# Patient Record
Sex: Female | Born: 1960 | Race: White | Hispanic: No | Marital: Married | Smoking: Former smoker
Health system: Southern US, Community
[De-identification: ages and names within clinical notes are randomized; demographics above are authoritative.]

---

## 1997-07-25 ENCOUNTER — Other Ambulatory Visit: Admission: RE | Admit: 1997-07-25 | Discharge: 1997-07-25 | Payer: Self-pay | Admitting: Obstetrics and Gynecology

## 2001-10-22 ENCOUNTER — Encounter: Payer: Self-pay | Admitting: *Deleted

## 2001-10-22 ENCOUNTER — Encounter: Admission: RE | Admit: 2001-10-22 | Discharge: 2001-10-22 | Payer: Self-pay | Admitting: *Deleted

## 2002-04-22 ENCOUNTER — Encounter: Admission: RE | Admit: 2002-04-22 | Discharge: 2002-04-22 | Payer: Self-pay | Admitting: Internal Medicine

## 2002-04-27 ENCOUNTER — Encounter: Payer: Self-pay | Admitting: Internal Medicine

## 2002-04-27 ENCOUNTER — Encounter: Admission: RE | Admit: 2002-04-27 | Discharge: 2002-04-27 | Payer: Self-pay | Admitting: Internal Medicine

## 2003-02-11 HISTORY — PX: AUGMENTATION MAMMAPLASTY: SUR837

## 2003-05-11 ENCOUNTER — Encounter: Admission: RE | Admit: 2003-05-11 | Discharge: 2003-05-11 | Payer: Self-pay | Admitting: Obstetrics and Gynecology

## 2003-08-09 ENCOUNTER — Emergency Department (HOSPITAL_COMMUNITY): Admission: EM | Admit: 2003-08-09 | Discharge: 2003-08-09 | Payer: Self-pay | Admitting: Emergency Medicine

## 2003-08-10 ENCOUNTER — Emergency Department (HOSPITAL_COMMUNITY): Admission: EM | Admit: 2003-08-10 | Discharge: 2003-08-10 | Payer: Self-pay | Admitting: Emergency Medicine

## 2004-09-19 ENCOUNTER — Encounter: Admission: RE | Admit: 2004-09-19 | Discharge: 2004-09-19 | Payer: Self-pay | Admitting: Obstetrics and Gynecology

## 2009-01-11 ENCOUNTER — Encounter: Admission: RE | Admit: 2009-01-11 | Discharge: 2009-01-11 | Payer: Self-pay | Admitting: Family Medicine

## 2010-12-06 ENCOUNTER — Other Ambulatory Visit: Payer: Self-pay | Admitting: Family Medicine

## 2010-12-06 DIAGNOSIS — Z1231 Encounter for screening mammogram for malignant neoplasm of breast: Secondary | ICD-10-CM

## 2010-12-25 ENCOUNTER — Ambulatory Visit
Admission: RE | Admit: 2010-12-25 | Discharge: 2010-12-25 | Disposition: A | Discharge status: Home / Self Care | Payer: BC Managed Care – PPO | Source: Ambulatory Visit | Attending: Family Medicine | Admitting: Family Medicine

## 2010-12-25 DIAGNOSIS — Z1231 Encounter for screening mammogram for malignant neoplasm of breast: Secondary | ICD-10-CM

## 2013-06-14 ENCOUNTER — Other Ambulatory Visit: Payer: Self-pay

## 2013-06-14 DIAGNOSIS — Z1231 Encounter for screening mammogram for malignant neoplasm of breast: Secondary | ICD-10-CM

## 2013-06-28 ENCOUNTER — Encounter (INDEPENDENT_AMBULATORY_CARE_PROVIDER_SITE_OTHER): Payer: Self-pay

## 2013-06-28 ENCOUNTER — Ambulatory Visit
Admission: RE | Admit: 2013-06-28 | Discharge: 2013-06-28 | Disposition: A | Discharge status: Home / Self Care | Payer: BC Managed Care – PPO | Source: Ambulatory Visit

## 2013-06-28 DIAGNOSIS — Z1231 Encounter for screening mammogram for malignant neoplasm of breast: Secondary | ICD-10-CM

## 2015-02-28 ENCOUNTER — Other Ambulatory Visit: Payer: Self-pay

## 2015-02-28 DIAGNOSIS — Z1231 Encounter for screening mammogram for malignant neoplasm of breast: Secondary | ICD-10-CM

## 2015-04-10 ENCOUNTER — Ambulatory Visit
Admission: RE | Admit: 2015-04-10 | Discharge: 2015-04-10 | Disposition: A | Discharge status: Home / Self Care | Payer: 59 | Source: Ambulatory Visit

## 2015-04-10 DIAGNOSIS — Z1231 Encounter for screening mammogram for malignant neoplasm of breast: Secondary | ICD-10-CM

## 2016-07-29 ENCOUNTER — Other Ambulatory Visit (HOSPITAL_COMMUNITY)
Admission: RE | Admit: 2016-07-29 | Discharge: 2016-07-29 | Disposition: A | Discharge status: Home / Self Care | Payer: 59 | Source: Ambulatory Visit | Attending: Family Medicine | Admitting: Family Medicine

## 2016-07-29 ENCOUNTER — Other Ambulatory Visit: Payer: Self-pay | Admitting: Family Medicine

## 2016-07-29 DIAGNOSIS — Z124 Encounter for screening for malignant neoplasm of cervix: Secondary | ICD-10-CM | POA: Diagnosis not present

## 2016-07-31 LAB — CYTOLOGY - PAP: HPV: NOT DETECTED

## 2016-11-11 ENCOUNTER — Other Ambulatory Visit: Payer: Self-pay | Admitting: Family Medicine

## 2016-11-11 DIAGNOSIS — Z1231 Encounter for screening mammogram for malignant neoplasm of breast: Secondary | ICD-10-CM

## 2016-11-20 ENCOUNTER — Ambulatory Visit: Payer: 59

## 2016-11-24 ENCOUNTER — Other Ambulatory Visit: Payer: Self-pay | Admitting: Family Medicine

## 2016-11-24 DIAGNOSIS — Z1231 Encounter for screening mammogram for malignant neoplasm of breast: Secondary | ICD-10-CM

## 2016-12-10 ENCOUNTER — Ambulatory Visit
Admission: RE | Admit: 2016-12-10 | Discharge: 2016-12-10 | Disposition: A | Discharge status: Home / Self Care | Payer: 59 | Source: Ambulatory Visit | Attending: Family Medicine | Admitting: Family Medicine

## 2016-12-10 DIAGNOSIS — Z1231 Encounter for screening mammogram for malignant neoplasm of breast: Secondary | ICD-10-CM

## 2017-08-26 DIAGNOSIS — Z0000 Encounter for general adult medical examination without abnormal findings: Secondary | ICD-10-CM | POA: Diagnosis not present

## 2017-08-26 DIAGNOSIS — G4700 Insomnia, unspecified: Secondary | ICD-10-CM | POA: Diagnosis not present

## 2017-08-26 DIAGNOSIS — Z131 Encounter for screening for diabetes mellitus: Secondary | ICD-10-CM | POA: Diagnosis not present

## 2017-08-26 DIAGNOSIS — Z13220 Encounter for screening for lipoid disorders: Secondary | ICD-10-CM | POA: Diagnosis not present

## 2017-08-26 DIAGNOSIS — Z6820 Body mass index (BMI) 20.0-20.9, adult: Secondary | ICD-10-CM | POA: Diagnosis not present

## 2018-03-24 ENCOUNTER — Other Ambulatory Visit: Payer: Self-pay | Admitting: Family Medicine

## 2018-03-24 DIAGNOSIS — Z1231 Encounter for screening mammogram for malignant neoplasm of breast: Secondary | ICD-10-CM

## 2018-03-26 ENCOUNTER — Ambulatory Visit
Admission: RE | Admit: 2018-03-26 | Discharge: 2018-03-26 | Disposition: A | Discharge status: Home / Self Care | Payer: BLUE CROSS/BLUE SHIELD | Source: Ambulatory Visit | Attending: Family Medicine | Admitting: Family Medicine

## 2018-03-26 DIAGNOSIS — Z1231 Encounter for screening mammogram for malignant neoplasm of breast: Secondary | ICD-10-CM | POA: Diagnosis not present

## 2018-05-12 DIAGNOSIS — F4323 Adjustment disorder with mixed anxiety and depressed mood: Secondary | ICD-10-CM | POA: Diagnosis not present

## 2018-06-10 DIAGNOSIS — F4323 Adjustment disorder with mixed anxiety and depressed mood: Secondary | ICD-10-CM | POA: Diagnosis not present

## 2018-07-08 DIAGNOSIS — F4323 Adjustment disorder with mixed anxiety and depressed mood: Secondary | ICD-10-CM | POA: Diagnosis not present

## 2018-07-15 DIAGNOSIS — F4323 Adjustment disorder with mixed anxiety and depressed mood: Secondary | ICD-10-CM | POA: Diagnosis not present

## 2018-07-20 DIAGNOSIS — F4323 Adjustment disorder with mixed anxiety and depressed mood: Secondary | ICD-10-CM | POA: Diagnosis not present

## 2018-07-29 DIAGNOSIS — F4323 Adjustment disorder with mixed anxiety and depressed mood: Secondary | ICD-10-CM | POA: Diagnosis not present

## 2018-08-19 DIAGNOSIS — F4323 Adjustment disorder with mixed anxiety and depressed mood: Secondary | ICD-10-CM | POA: Diagnosis not present

## 2018-08-23 DIAGNOSIS — F4323 Adjustment disorder with mixed anxiety and depressed mood: Secondary | ICD-10-CM | POA: Diagnosis not present

## 2018-08-31 DIAGNOSIS — F4323 Adjustment disorder with mixed anxiety and depressed mood: Secondary | ICD-10-CM | POA: Diagnosis not present

## 2018-09-07 DIAGNOSIS — F4323 Adjustment disorder with mixed anxiety and depressed mood: Secondary | ICD-10-CM | POA: Diagnosis not present

## 2018-09-08 DIAGNOSIS — F419 Anxiety disorder, unspecified: Secondary | ICD-10-CM | POA: Diagnosis not present

## 2018-09-08 DIAGNOSIS — M8580 Other specified disorders of bone density and structure, unspecified site: Secondary | ICD-10-CM | POA: Diagnosis not present

## 2018-09-08 DIAGNOSIS — G4700 Insomnia, unspecified: Secondary | ICD-10-CM | POA: Diagnosis not present

## 2018-09-21 DIAGNOSIS — F4323 Adjustment disorder with mixed anxiety and depressed mood: Secondary | ICD-10-CM | POA: Diagnosis not present

## 2018-09-25 DIAGNOSIS — F4323 Adjustment disorder with mixed anxiety and depressed mood: Secondary | ICD-10-CM | POA: Diagnosis not present

## 2018-10-26 DIAGNOSIS — F4323 Adjustment disorder with mixed anxiety and depressed mood: Secondary | ICD-10-CM | POA: Diagnosis not present

## 2018-11-10 DIAGNOSIS — F4323 Adjustment disorder with mixed anxiety and depressed mood: Secondary | ICD-10-CM | POA: Diagnosis not present

## 2018-11-24 DIAGNOSIS — F4323 Adjustment disorder with mixed anxiety and depressed mood: Secondary | ICD-10-CM | POA: Diagnosis not present

## 2018-12-07 DIAGNOSIS — F4323 Adjustment disorder with mixed anxiety and depressed mood: Secondary | ICD-10-CM | POA: Diagnosis not present

## 2018-12-09 DIAGNOSIS — F4323 Adjustment disorder with mixed anxiety and depressed mood: Secondary | ICD-10-CM | POA: Diagnosis not present

## 2019-01-03 DIAGNOSIS — F4323 Adjustment disorder with mixed anxiety and depressed mood: Secondary | ICD-10-CM | POA: Diagnosis not present

## 2019-01-18 DIAGNOSIS — F4323 Adjustment disorder with mixed anxiety and depressed mood: Secondary | ICD-10-CM | POA: Diagnosis not present

## 2019-01-26 DIAGNOSIS — F4323 Adjustment disorder with mixed anxiety and depressed mood: Secondary | ICD-10-CM | POA: Diagnosis not present

## 2019-02-16 DIAGNOSIS — F4323 Adjustment disorder with mixed anxiety and depressed mood: Secondary | ICD-10-CM | POA: Diagnosis not present

## 2019-02-18 DIAGNOSIS — F4323 Adjustment disorder with mixed anxiety and depressed mood: Secondary | ICD-10-CM | POA: Diagnosis not present

## 2019-03-01 DIAGNOSIS — F4323 Adjustment disorder with mixed anxiety and depressed mood: Secondary | ICD-10-CM | POA: Diagnosis not present

## 2019-03-10 DIAGNOSIS — F4323 Adjustment disorder with mixed anxiety and depressed mood: Secondary | ICD-10-CM | POA: Diagnosis not present

## 2019-03-16 DIAGNOSIS — F4323 Adjustment disorder with mixed anxiety and depressed mood: Secondary | ICD-10-CM | POA: Diagnosis not present

## 2019-04-19 DIAGNOSIS — F4323 Adjustment disorder with mixed anxiety and depressed mood: Secondary | ICD-10-CM | POA: Diagnosis not present

## 2019-05-03 DIAGNOSIS — F4323 Adjustment disorder with mixed anxiety and depressed mood: Secondary | ICD-10-CM | POA: Diagnosis not present

## 2019-07-05 ENCOUNTER — Other Ambulatory Visit: Payer: Self-pay | Admitting: Family Medicine

## 2019-07-05 DIAGNOSIS — Z1231 Encounter for screening mammogram for malignant neoplasm of breast: Secondary | ICD-10-CM

## 2019-07-07 ENCOUNTER — Other Ambulatory Visit: Payer: Self-pay

## 2019-07-07 ENCOUNTER — Ambulatory Visit
Admission: RE | Admit: 2019-07-07 | Discharge: 2019-07-07 | Disposition: A | Discharge status: Home / Self Care | Payer: BLUE CROSS/BLUE SHIELD | Source: Ambulatory Visit | Attending: Family Medicine | Admitting: Family Medicine

## 2019-07-07 DIAGNOSIS — Z1231 Encounter for screening mammogram for malignant neoplasm of breast: Secondary | ICD-10-CM | POA: Diagnosis not present

## 2019-07-17 LAB — EXTERNAL GENERIC LAB PROCEDURE: COLOGUARD: NEGATIVE

## 2019-09-08 ENCOUNTER — Other Ambulatory Visit: Payer: Self-pay | Admitting: Family Medicine

## 2019-09-08 DIAGNOSIS — M8580 Other specified disorders of bone density and structure, unspecified site: Secondary | ICD-10-CM

## 2019-12-07 ENCOUNTER — Ambulatory Visit
Admission: RE | Admit: 2019-12-07 | Discharge: 2019-12-07 | Disposition: A | Discharge status: Home / Self Care | Payer: BC Managed Care – PPO | Source: Ambulatory Visit | Attending: Family Medicine | Admitting: Family Medicine

## 2019-12-07 ENCOUNTER — Other Ambulatory Visit: Payer: Self-pay

## 2019-12-07 DIAGNOSIS — M8580 Other specified disorders of bone density and structure, unspecified site: Secondary | ICD-10-CM

## 2020-05-08 ENCOUNTER — Encounter: Payer: Self-pay | Admitting: Plastic Surgery

## 2020-05-08 ENCOUNTER — Other Ambulatory Visit: Payer: Self-pay

## 2020-05-08 ENCOUNTER — Ambulatory Visit (INDEPENDENT_AMBULATORY_CARE_PROVIDER_SITE_OTHER): Payer: Self-pay | Admitting: Plastic Surgery

## 2020-05-08 DIAGNOSIS — N6481 Ptosis of breast: Secondary | ICD-10-CM | POA: Insufficient documentation

## 2020-05-08 NOTE — Progress Notes (Signed)
Patient ID: Hayley Ellis, female    DOB: 04-18-1960, 60 y.o.   MRN: [id number]   Chief Complaint  Patient presents with  . Advice Only  . Breast Problem    The patient is a 60 year old female here for evaluation of her breasts.  The patient had saline implants placed in 2005 by Dr. Dub Amis.  She went from a B cup to a D/DD cup.  She is not sure about the size or the brand.  She is going to try and find her card at home.  We will try and get information as well with a release of information form.  She has mammograms regularly and states she is up-to-date.  The last mammogram was negative.  She is 5 feet 4 inches tall and weighs 123 pounds.  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  She has had facial surgery by Dr. Conni Elliot in [place].  He is no longer operating so she wanted to find somebody closer to her home here in [place].  On palpation I do not feel anything concerning.  The implants appear to be intact.  She does not have a severe capsule.  She thinks that the placement was.  [place].  She is afraid if she does not have new implants placed and a lift that she will not have any breast after these implants are removed.  I think she would be quite small in her breast size.  She is interested in possibly going smaller.  She has a little bit of ptosis but nothing severe a little bit of excess skin as well.     Review of Systems  Constitutional: Negative.   HENT: Negative.   Eyes: Negative.   Respiratory: Negative.   Cardiovascular: Negative.   Gastrointestinal: Negative.   Endocrine: Negative.   Genitourinary: Negative.   Musculoskeletal: Negative.   Skin: Negative.   Hematological: Negative.   Psychiatric/Behavioral: Negative.     History reviewed. No pertinent past medical history.  Past Surgical History:  Procedure Laterality Date  . AUGMENTATION MAMMAPLASTY Bilateral 2005      Current Outpatient Medications:  .  buPROPion (WELLBUTRIN XL) 150 MG 24 hr  tablet, Take 150 mg by mouth daily., Disp: , Rfl:  .  traZODone (DESYREL) 50 MG tablet, Take 1 tablet by mouth at bedtime., Disp: , Rfl:    Objective:   Vitals:   05/08/20 0919  BP: (!) 140/99  Pulse: 81  SpO2: 98%    Physical Exam Vitals and nursing note reviewed.  Constitutional:      Appearance: Normal appearance.  HENT:     Head: Normocephalic and atraumatic.  Cardiovascular:     Rate and Rhythm: Normal rate.     Pulses: Normal pulses.  Pulmonary:     Effort: Pulmonary effort is normal.  Abdominal:     General: Abdomen is flat.  Musculoskeletal:        General: No swelling or deformity.  Skin:    General: Skin is warm.     Capillary Refill: Capillary refill takes less than 2 seconds.     Coloration: Skin is not jaundiced.  Neurological:     General: No focal deficit present.     Mental Status: She is alert and oriented to person, place, and time.  Psychiatric:        Mood and Affect: Mood normal.        Behavior: Behavior normal.     Assessment & Plan:  Breast ptosis  The patient is at a time frame that the implants should be replaced.  I think going smaller is likely a good idea.  She will obviously not saline plants but is not for sure.  She would also like a mastopexy.  We talked about the scar and the downtime.  We will provide her with a quote and give her time to think it over.  Pictures were obtained of the patient and placed in the chart with the patient's or guardian's permission.   Alena Bills Khadijah Mastrianni, DO

## 2020-06-11 ENCOUNTER — Other Ambulatory Visit: Payer: Self-pay | Admitting: Family Medicine

## 2020-06-11 DIAGNOSIS — Z1231 Encounter for screening mammogram for malignant neoplasm of breast: Secondary | ICD-10-CM

## 2020-08-01 ENCOUNTER — Ambulatory Visit
Admission: RE | Admit: 2020-08-01 | Discharge: 2020-08-01 | Disposition: A | Discharge status: Home / Self Care | Payer: 59 | Source: Ambulatory Visit | Attending: Family Medicine | Admitting: Family Medicine

## 2020-08-01 ENCOUNTER — Other Ambulatory Visit: Payer: Self-pay

## 2020-08-01 DIAGNOSIS — Z1231 Encounter for screening mammogram for malignant neoplasm of breast: Secondary | ICD-10-CM

## 2021-02-14 DIAGNOSIS — R0683 Snoring: Secondary | ICD-10-CM | POA: Diagnosis not present

## 2021-08-19 DIAGNOSIS — B349 Viral infection, unspecified: Secondary | ICD-10-CM | POA: Diagnosis not present

## 2021-08-19 DIAGNOSIS — L500 Allergic urticaria: Secondary | ICD-10-CM | POA: Diagnosis not present

## 2021-09-08 DIAGNOSIS — R030 Elevated blood-pressure reading, without diagnosis of hypertension: Secondary | ICD-10-CM | POA: Diagnosis not present

## 2021-09-08 DIAGNOSIS — J189 Pneumonia, unspecified organism: Secondary | ICD-10-CM | POA: Diagnosis not present

## 2021-09-08 DIAGNOSIS — J209 Acute bronchitis, unspecified: Secondary | ICD-10-CM | POA: Diagnosis not present

## 2021-09-24 DIAGNOSIS — M654 Radial styloid tenosynovitis [de Quervain]: Secondary | ICD-10-CM | POA: Diagnosis not present

## 2021-10-11 DIAGNOSIS — Z13220 Encounter for screening for lipoid disorders: Secondary | ICD-10-CM | POA: Diagnosis not present

## 2021-10-11 DIAGNOSIS — Z1211 Encounter for screening for malignant neoplasm of colon: Secondary | ICD-10-CM | POA: Diagnosis not present

## 2022-02-25 DIAGNOSIS — M654 Radial styloid tenosynovitis [de Quervain]: Secondary | ICD-10-CM | POA: Diagnosis not present

## 2022-03-01 DIAGNOSIS — J209 Acute bronchitis, unspecified: Secondary | ICD-10-CM | POA: Diagnosis not present

## 2022-03-21 DIAGNOSIS — M25531 Pain in right wrist: Secondary | ICD-10-CM | POA: Diagnosis not present

## 2022-03-24 DIAGNOSIS — M25531 Pain in right wrist: Secondary | ICD-10-CM | POA: Diagnosis not present

## 2022-03-28 DIAGNOSIS — M25531 Pain in right wrist: Secondary | ICD-10-CM | POA: Diagnosis not present

## 2022-04-08 DIAGNOSIS — J069 Acute upper respiratory infection, unspecified: Secondary | ICD-10-CM | POA: Diagnosis not present

## 2022-04-11 DIAGNOSIS — M25531 Pain in right wrist: Secondary | ICD-10-CM | POA: Diagnosis not present

## 2022-04-25 DIAGNOSIS — S52501D Unspecified fracture of the lower end of right radius, subsequent encounter for closed fracture with routine healing: Secondary | ICD-10-CM | POA: Diagnosis not present

## 2022-04-25 DIAGNOSIS — M25531 Pain in right wrist: Secondary | ICD-10-CM | POA: Diagnosis not present

## 2022-04-25 DIAGNOSIS — S52601D Unspecified fracture of lower end of right ulna, subsequent encounter for closed fracture with routine healing: Secondary | ICD-10-CM | POA: Diagnosis not present

## 2022-10-09 IMAGING — MG DIGITAL SCREENING BREAST BILAT IMPLANT W/ TOMO W/ CAD
9 of 14 series · 9 of 34 positions shown · non-contrast
Comparison: Previous exam(s).

CLINICAL DATA: Screening.

EXAM:
DIGITAL SCREENING BILATERAL MAMMOGRAM WITH IMPLANTS, CAD AND
TOMOSYNTHESIS
TECHNIQUE: Bilateral screening digital craniocaudal and mediolateral oblique
mammograms were obtained. Bilateral screening digital breast
tomosynthesis was performed. The images were evaluated with
computer-aided detection. Standard and/or implant displaced views
were performed.

[L CC]
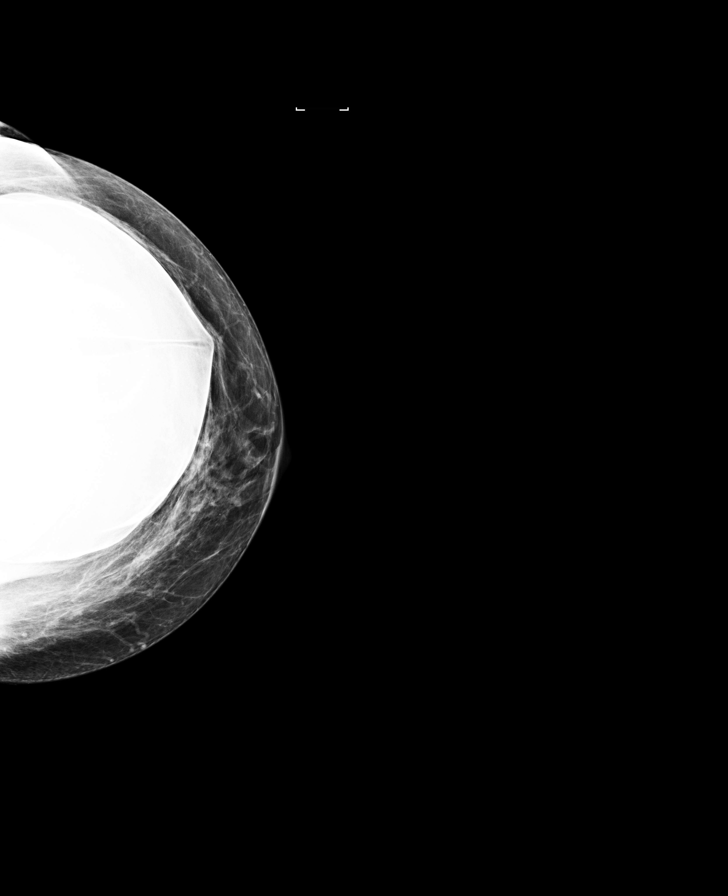

[R CC]
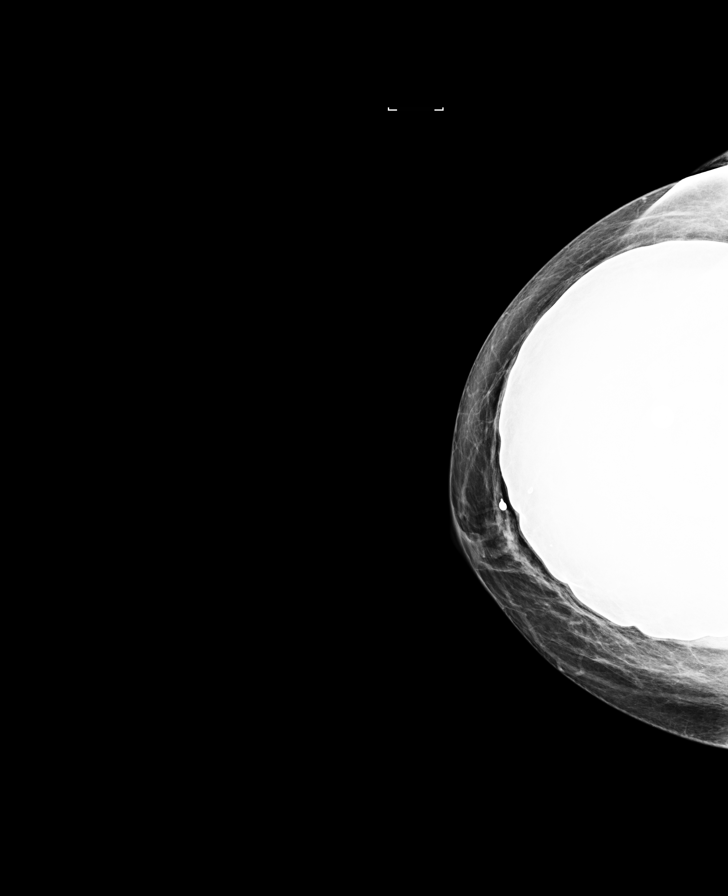

[L MLO]
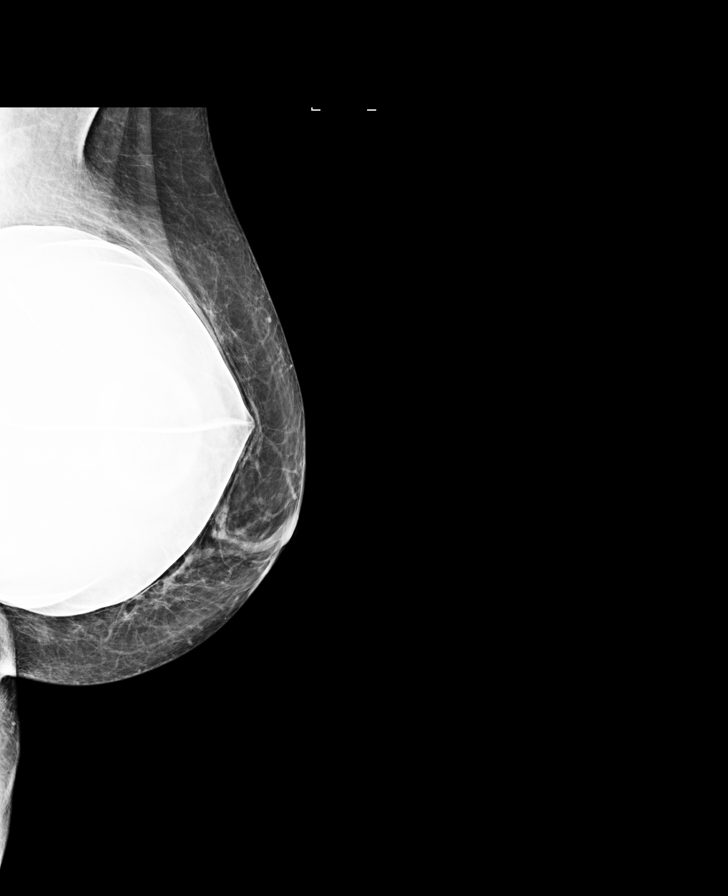

[R MLO]
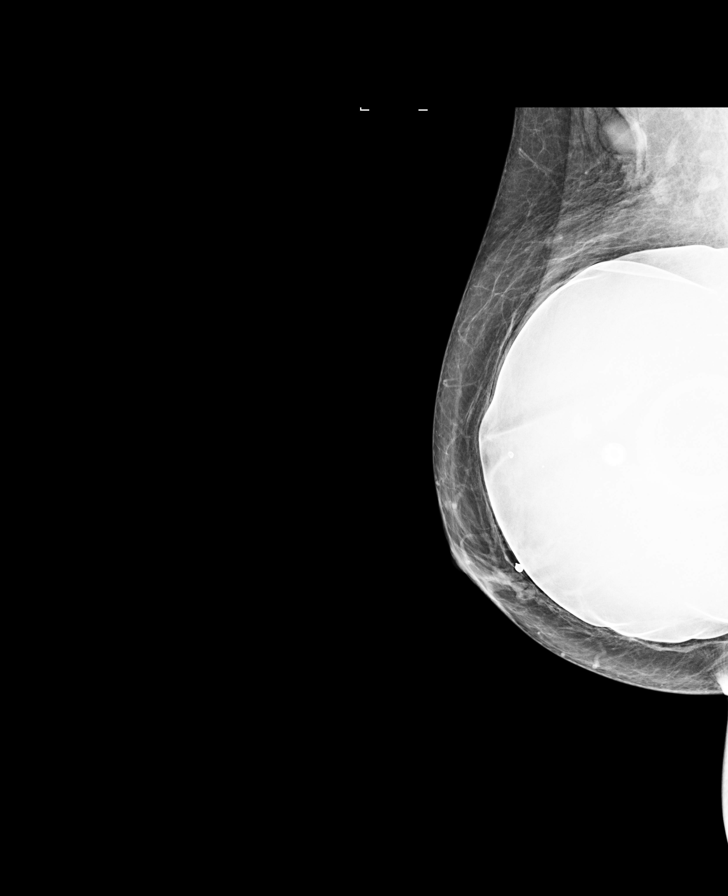

[R MLO synth-2D (1 of 2)]
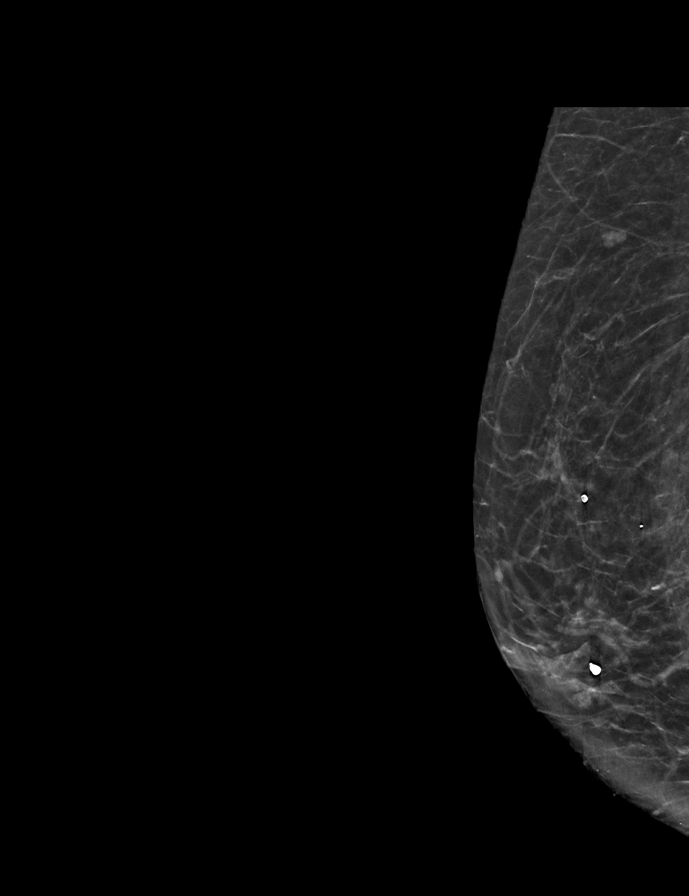

[R MLO synth-2D (2 of 2)]
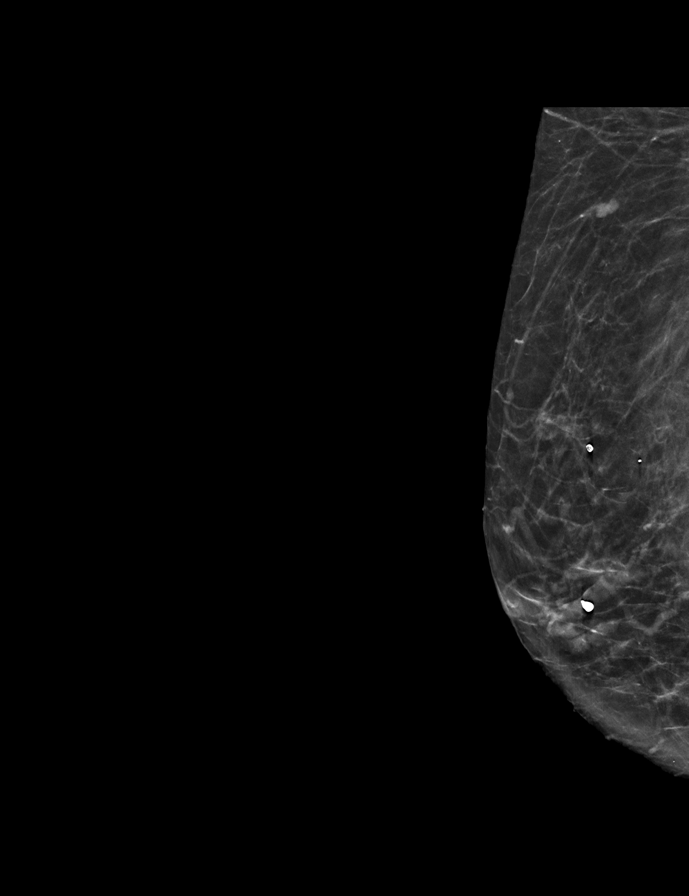

[R CC synth-2D]
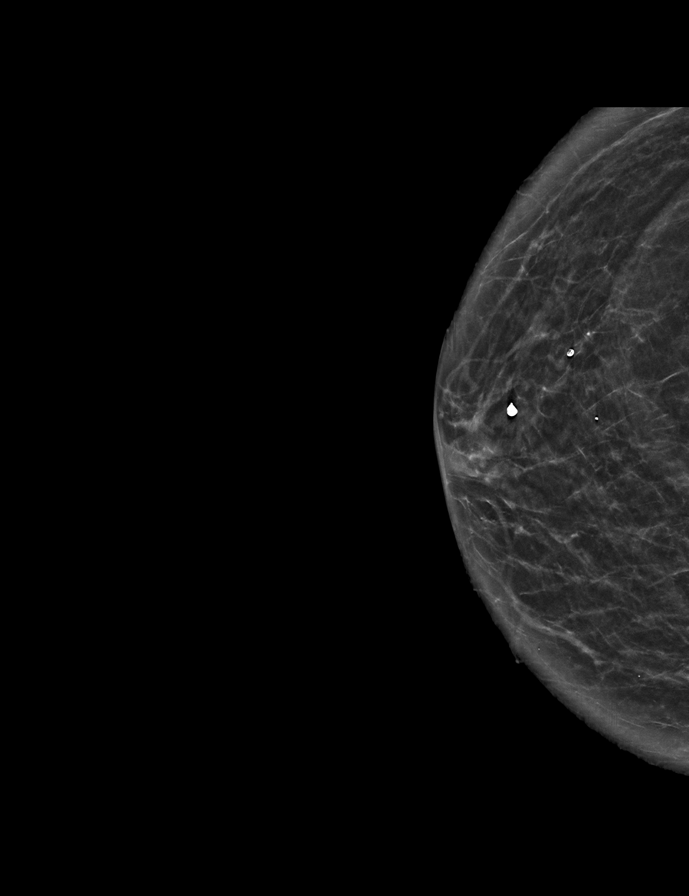

[L MLO synth-2D]
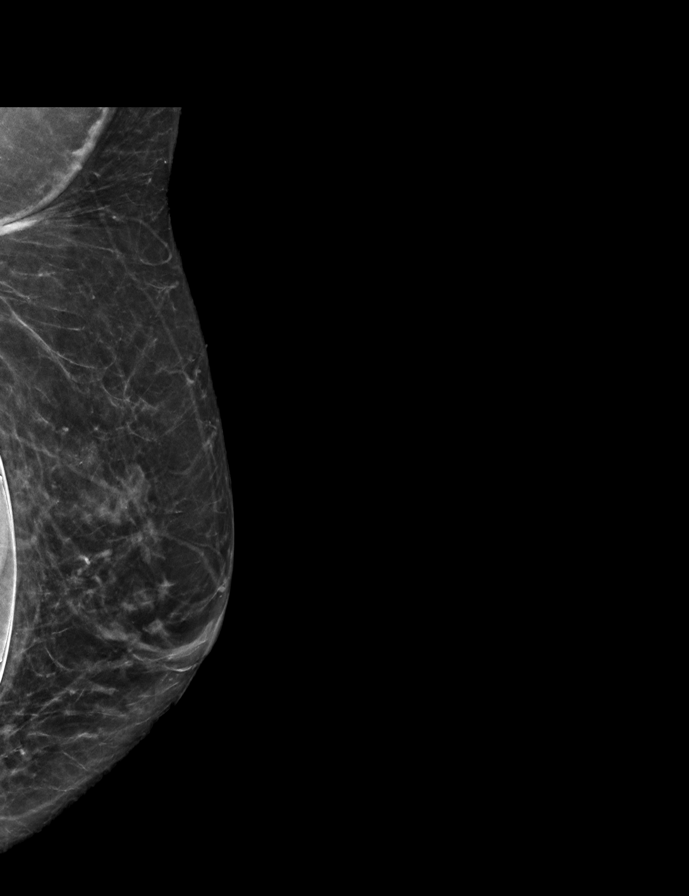

[L CC synth-2D]
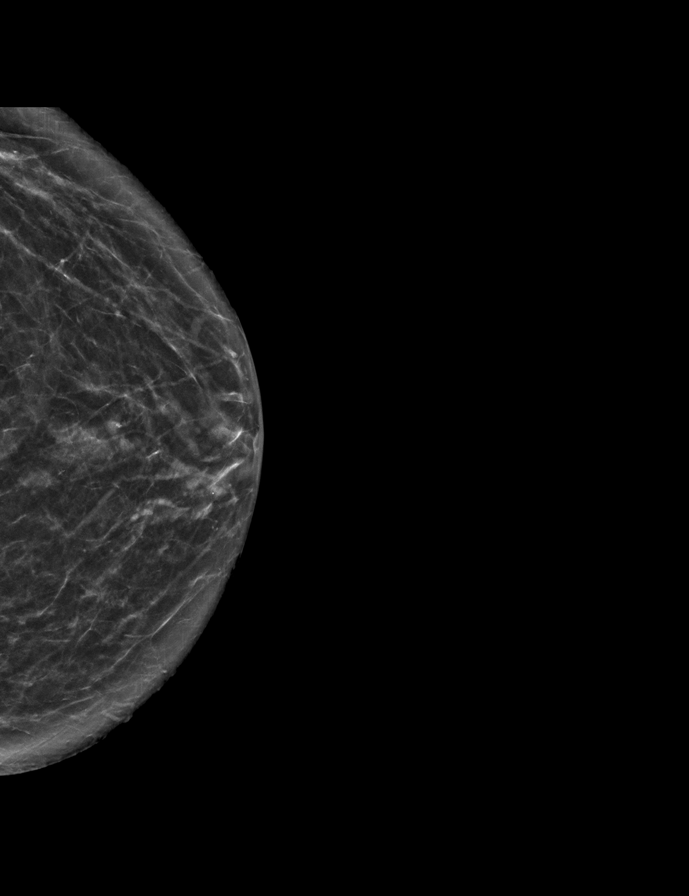

[9 of 34 positions shown; findings below may reference images not displayed]

ACR Breast Density Category b: There are scattered areas of
fibroglandular density.
FINDINGS: There are no findings suspicious for malignancy.
IMPRESSION: No mammographic evidence of malignancy. A result letter of this
screening mammogram will be mailed directly to the patient.

RECOMMENDATION:
Screening mammogram in one year. (Code:NE-Z-IH7)

BI-RADS CATEGORY  1: Negative.

## 2022-10-11 DIAGNOSIS — J189 Pneumonia, unspecified organism: Secondary | ICD-10-CM | POA: Diagnosis not present

## 2022-10-17 DIAGNOSIS — Z681 Body mass index (BMI) 19 or less, adult: Secondary | ICD-10-CM | POA: Diagnosis not present

## 2022-10-17 DIAGNOSIS — Z13220 Encounter for screening for lipoid disorders: Secondary | ICD-10-CM | POA: Diagnosis not present

## 2022-10-17 DIAGNOSIS — Z0000 Encounter for general adult medical examination without abnormal findings: Secondary | ICD-10-CM | POA: Diagnosis not present

## 2022-10-17 DIAGNOSIS — A6004 Herpesviral vulvovaginitis: Secondary | ICD-10-CM | POA: Diagnosis not present

## 2022-10-17 DIAGNOSIS — G4700 Insomnia, unspecified: Secondary | ICD-10-CM | POA: Diagnosis not present

## 2022-10-17 DIAGNOSIS — M810 Age-related osteoporosis without current pathological fracture: Secondary | ICD-10-CM | POA: Diagnosis not present

## 2022-10-17 DIAGNOSIS — Z23 Encounter for immunization: Secondary | ICD-10-CM | POA: Diagnosis not present

## 2023-05-13 DIAGNOSIS — Z1231 Encounter for screening mammogram for malignant neoplasm of breast: Secondary | ICD-10-CM | POA: Diagnosis not present

## 2023-06-19 DIAGNOSIS — R928 Other abnormal and inconclusive findings on diagnostic imaging of breast: Secondary | ICD-10-CM | POA: Diagnosis not present

## 2023-06-19 DIAGNOSIS — R2231 Localized swelling, mass and lump, right upper limb: Secondary | ICD-10-CM | POA: Diagnosis not present

## 2023-06-19 DIAGNOSIS — Z9882 Breast implant status: Secondary | ICD-10-CM | POA: Diagnosis not present

## 2023-08-16 DIAGNOSIS — Z1211 Encounter for screening for malignant neoplasm of colon: Secondary | ICD-10-CM | POA: Diagnosis not present

## 2023-08-16 DIAGNOSIS — Z1212 Encounter for screening for malignant neoplasm of rectum: Secondary | ICD-10-CM | POA: Diagnosis not present
